# Patient Record
Sex: Female | Born: 2000 | Race: Black or African American | Hispanic: No | Marital: Single | Smoking: Never smoker
Health system: Southern US, Community
[De-identification: ages and names within clinical notes are randomized; demographics above are authoritative.]

## PROBLEM LIST (undated history)

## (undated) DIAGNOSIS — L509 Urticaria, unspecified: Secondary | ICD-10-CM

## (undated) DIAGNOSIS — L309 Dermatitis, unspecified: Secondary | ICD-10-CM

## (undated) DIAGNOSIS — J302 Other seasonal allergic rhinitis: Secondary | ICD-10-CM

## (undated) HISTORY — DX: Other seasonal allergic rhinitis: J30.2

## (undated) HISTORY — DX: Urticaria, unspecified: L50.9

## (undated) HISTORY — DX: Dermatitis, unspecified: L30.9

---
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
## 2019-03-15 ENCOUNTER — Other Ambulatory Visit: Payer: Self-pay

## 2019-03-15 DIAGNOSIS — Z20822 Contact with and (suspected) exposure to covid-19: Secondary | ICD-10-CM

## 2019-03-16 LAB — NOVEL CORONAVIRUS, NAA: SARS-CoV-2, NAA: NOT DETECTED

## 2019-10-11 ENCOUNTER — Other Ambulatory Visit: Payer: Self-pay | Admitting: Family Medicine

## 2019-10-11 NOTE — Telephone Encounter (Signed)
Last visit 10/13/18 for rash

## 2019-10-31 ENCOUNTER — Encounter: Payer: Self-pay | Admitting: Family Medicine

## 2019-11-01 ENCOUNTER — Encounter: Payer: Self-pay | Admitting: Family Medicine

## 2020-10-28 ENCOUNTER — Other Ambulatory Visit: Payer: Self-pay

## 2021-02-13 DIAGNOSIS — H52223 Regular astigmatism, bilateral: Secondary | ICD-10-CM | POA: Diagnosis not present

## 2021-02-13 DIAGNOSIS — H5203 Hypermetropia, bilateral: Secondary | ICD-10-CM | POA: Diagnosis not present

## 2021-11-24 ENCOUNTER — Encounter: Payer: Self-pay | Admitting: Women's Health

## 2021-11-24 ENCOUNTER — Other Ambulatory Visit: Payer: Self-pay

## 2021-11-24 ENCOUNTER — Other Ambulatory Visit (HOSPITAL_COMMUNITY)
Admission: RE | Admit: 2021-11-24 | Discharge: 2021-11-24 | Disposition: A | Discharge status: Home / Self Care | Payer: Managed Care, Other (non HMO) | Source: Ambulatory Visit | Attending: Women's Health | Admitting: Women's Health

## 2021-11-24 ENCOUNTER — Ambulatory Visit (INDEPENDENT_AMBULATORY_CARE_PROVIDER_SITE_OTHER): Payer: Managed Care, Other (non HMO) | Admitting: Women's Health

## 2021-11-24 VITALS — BP 139/94 | HR 114 | Ht 64.0 in | Wt 134.0 lb

## 2021-11-24 DIAGNOSIS — R030 Elevated blood-pressure reading, without diagnosis of hypertension: Secondary | ICD-10-CM

## 2021-11-24 DIAGNOSIS — Z01419 Encounter for gynecological examination (general) (routine) without abnormal findings: Secondary | ICD-10-CM | POA: Diagnosis present

## 2021-11-24 NOTE — Patient Instructions (Signed)
Check bp twice daily

## 2021-11-24 NOTE — Progress Notes (Signed)
WELL-WOMAN EXAMINATION Patient name: Alexis Barber MRN [id number]  Date of birth: [birth year] Chief Complaint:   New Patient (Initial Visit) and Gynecologic Exam  History of Present Illness:   Alexis Barber is a 21 y.o. G0P0000 African-American female being seen today for a routine well-woman exam.  Current complaints: none BP elevated, no h/o HTN, thinks dad has HTN. Doesn't feel anxiou  PCP: Luking      does not desire labs Patient's last menstrual period was 11/02/2021 (exact date). The current method of family planning is abstinence and OCP (estrogen/progesterone).  Last pap never. Results were: N/A. H/O abnormal pap: no Last mammogram: never. Results were: N/A. Family h/o breast cancer: no Last colonoscopy: never. Results were: N/A. Family h/o colorectal cancer: no  Depression screen [facility] 2/9 11/24/2021  Decreased Interest 0  Down, Depressed, Hopeless 0  PHQ - 2 Score 0  Altered sleeping 0  Tired, decreased energy 0  Change in appetite 0  Feeling bad or failure about yourself  0  Trouble concentrating 0  Moving slowly or fidgety/restless 0  Suicidal thoughts 0  PHQ-9 Score 0     GAD 7 : Generalized Anxiety Score 11/24/2021  Nervous, Anxious, on Edge 0  Control/stop worrying 0  Worry too much - different things 0  Trouble relaxing 0  Restless 0  Easily annoyed or irritable 0  Afraid - awful might happen 0  Total GAD 7 Score 0     Review of Systems:   Pertinent items are noted in HPI Denies any headaches, blurred vision, fatigue, shortness of breath, chest pain, abdominal pain, abnormal vaginal discharge/itching/odor/irritation, problems with periods, bowel movements, urination, or intercourse unless otherwise stated above. Pertinent History Reviewed:  Reviewed past medical,surgical, social and family history.  Reviewed problem list, medications and allergies. Physical Assessment:   Vitals:   11/24/21 0854 11/24/21 0855 11/24/21 0923  BP: (!) 151/100 (!)  142/92 (!) 139/94  Pulse: (!) 113 (!) 125 (!) 114  Weight: 134 lb (60.8 kg)    Height: 5\' 4"  (1.626 m)    Body mass index is 23 kg/m.        Physical Examination:   General appearance - well appearing, and in no distress  Mental status - alert, oriented to person, place, and time  Psych:  She has a normal mood and affect  Skin - warm and dry, normal color, no suspicious lesions noted  Chest - effort normal, all lung fields clear to auscultation bilaterally  Heart - normal rate and regular rhythm  Neck:  midline trachea, no thyromegaly or nodules  Breasts - breasts appear normal, no suspicious masses, no skin or nipple changes or  axillary nodes  Abdomen - soft, nontender, nondistended, no masses or organomegaly  Pelvic - VULVA: normal appearing vulva with no masses, tenderness or lesions  VAGINA: normal appearing vagina with normal color and discharge, no lesions  CERVIX: normal appearing cervix without discharge or lesions, no CMT  Thin prep pap is done w/ reflex HR HPV cotesting  UTERUS: uterus is felt to be normal size, shape, consistency and nontender   ADNEXA: No adnexal masses or tenderness noted.  Extremities:  No swelling or varicosities noted  Chaperone: Angel Neas    No results found for this or any previous visit (from the past 24 hour(s)).  Assessment & Plan:  1) Well-Woman Exam  2) Elevated bp> no h/o HTN, repeat after exam still elevated. Pt to check bp's bid at home, bring log back  in 1wk and do bp check w/ nurse. If still elevated discussed need to go to progestin only methods- states she would want to stick w/ pills  Labs/procedures today: pap  Mammogram: @ 22yo, or sooner if problems Colonoscopy: @ 22yo, or sooner if problems  No orders of the defined types were placed in this encounter.   Meds: No orders of the defined types were placed in this encounter.   Follow-up: Return in about 1 week (around 12/01/2021) for nurse bp check.  Cheral Marker CNM,  [facility] 11/24/2021 9:34 AM

## 2021-11-26 LAB — CYTOLOGY - PAP
Adequacy: ABSENT
Chlamydia: NEGATIVE
Comment: NEGATIVE
Comment: NORMAL
Diagnosis: NEGATIVE
Neisseria Gonorrhea: NEGATIVE

## 2021-11-30 ENCOUNTER — Encounter: Payer: Self-pay | Admitting: Women's Health

## 2021-12-01 ENCOUNTER — Ambulatory Visit: Payer: Managed Care, Other (non HMO) | Admitting: *Deleted

## 2021-12-01 ENCOUNTER — Other Ambulatory Visit: Payer: Self-pay

## 2021-12-01 VITALS — BP 144/93 | HR 78

## 2021-12-01 DIAGNOSIS — Z0130 Encounter for examination of blood pressure without abnormal findings: Secondary | ICD-10-CM

## 2021-12-01 MED ORDER — SLYND 4 MG PO TABS: 1.0000 | ORAL_TABLET | Freq: Every day | ORAL | 3 refills | Status: DC

## 2021-12-01 NOTE — Addendum Note (Signed)
Addended by: Cheral Marker on: 12/01/2021 03:21 PM   Modules accepted: Orders

## 2021-12-01 NOTE — Progress Notes (Signed)
° °  NURSE VISIT- BLOOD PRESSURE CHECK  SUBJECTIVE:  Alexis Barber is a 21 y.o. G0P0000 female here for BP check. She is a GYN patient    HYPERTENSION ROS:  GYN patient: Taking medicines as instructed not applicable Headaches  No Chest pain No Shortness of breath No Swelling in legs/ankles No  OBJECTIVE:  BP (!) 144/93 (BP Location: Right Arm, Patient Position: Sitting, Cuff Size: Normal)    Pulse 78    LMP 11/02/2021 (Exact Date)   Appearance alert, well appearing, and in no distress.  ASSESSMENT: GYN  blood pressure check  PLAN: Discussed with Joellyn Haff, CNM, [facility]   Recommendations: new prescription will be sent  samples of slynd given Follow-up: as scheduled   Annamarie Dawley  12/01/2021 3:05 PM

## 2022-08-03 ENCOUNTER — Encounter: Payer: Self-pay | Admitting: Women's Health

## 2022-08-03 ENCOUNTER — Other Ambulatory Visit: Payer: Self-pay | Admitting: Women's Health

## 2022-10-05 DIAGNOSIS — L239 Allergic contact dermatitis, unspecified cause: Secondary | ICD-10-CM | POA: Diagnosis not present

## 2022-10-05 DIAGNOSIS — Z6820 Body mass index (BMI) 20.0-20.9, adult: Secondary | ICD-10-CM | POA: Diagnosis not present

## 2022-10-06 ENCOUNTER — Telehealth: Payer: Commercial Managed Care - PPO | Admitting: Family

## 2022-10-06 DIAGNOSIS — T7840XA Allergy, unspecified, initial encounter: Secondary | ICD-10-CM

## 2022-10-06 MED ORDER — PREDNISONE 10 MG (21) PO TBPK: ORAL_TABLET | ORAL | 0 refills | Status: DC

## 2022-10-06 NOTE — Progress Notes (Signed)
E Visit for Rash  We are sorry that you are not feeling well. Here is how we plan to help!    Based on what you shared with me you may have  an allergic reaction.   You should continue zyrtec and benadryl. I have also sent in Prednisone 10 mg daily for 6 days (see taper instructions below)  Directions for 6 day taper: Day 1: 2 tablets before breakfast, 1 after both lunch & dinner and 2 at bedtime Day 2: 1 tab before breakfast, 1 after both lunch & dinner and 2 at bedtime Day 3: 1 tab at each meal & 1 at bedtime Day 4: 1 tab at breakfast, 1 at lunch, 1 at bedtime Day 5: 1 tab at breakfast & 1 tab at bedtime Day 6: 1 tab at breakfast    HOME CARE:  Take cool showers and avoid direct sunlight. Apply cool compress or wet dressings. Take a bath in an oatmeal bath.  Sprinkle content of one Aveeno packet under running faucet with comfortably warm water.  Bathe for 15-20 minutes, 1-2 times daily.  Pat dry with a towel. Do not rub the rash. Use hydrocortisone cream. Take an antihistamine like Benadryl for widespread rashes that itch.  The adult dose of Benadryl is 25-50 mg by mouth 4 times daily. Caution:  This type of medication may cause sleepiness.  Do not drink alcohol, drive, or operate dangerous machinery while taking antihistamines.  Do not take these medications if you have prostate enlargement.  Read package instructions thoroughly on all medications that you take.  GET HELP RIGHT AWAY IF:  Symptoms don't go away after treatment. Severe itching that persists. If you rash spreads or swells. If you rash begins to smell. If it blisters and opens or develops a yellow-brown crust. You develop a fever. You have a sore throat. You become short of breath.  MAKE SURE YOU:  Understand these instructions. Will watch your condition. Will get help right away if you are not doing well or get worse.  Thank you for choosing an e-visit.  Your e-visit answers were reviewed by a board  certified advanced clinical practitioner to complete your personal care plan. Depending upon the condition, your plan could have included both over the counter or prescription medications.  Please review your pharmacy choice. Make sure the pharmacy is open so you can pick up prescription now. If there is a problem, you may contact your provider through [employer] and have the prescription routed to another pharmacy.  Your safety is important to [place]. If you have drug allergies check your prescription carefully.   For the next 24 hours you can use MyChart to ask questions about today's visit, request a non-urgent call back, or ask for a work or school excuse. You will get an email in the next two days asking about your experience. I hope that your e-visit has been valuable and will speed your recovery.  Approximately 5 minutes was spent documenting and reviewing patient's chart.

## 2022-10-07 ENCOUNTER — Encounter: Payer: Self-pay | Admitting: Family Medicine

## 2022-10-07 ENCOUNTER — Ambulatory Visit (INDEPENDENT_AMBULATORY_CARE_PROVIDER_SITE_OTHER): Payer: Commercial Managed Care - PPO | Admitting: Family Medicine

## 2022-10-07 VITALS — BP 126/85 | HR 90 | Temp 98.4°F | Wt 119.2 lb

## 2022-10-07 DIAGNOSIS — L509 Urticaria, unspecified: Secondary | ICD-10-CM | POA: Insufficient documentation

## 2022-10-07 MED ORDER — FAMOTIDINE 20 MG PO TABS: 20.0000 mg | ORAL_TABLET | Freq: Two times a day (BID) | ORAL | 0 refills | Status: DC

## 2022-10-07 MED ORDER — CETIRIZINE HCL 10 MG PO TABS: 10.0000 mg | ORAL_TABLET | Freq: Every day | ORAL | 3 refills | Status: DC

## 2022-10-07 MED ORDER — PREDNISONE 10 MG PO TABS: ORAL_TABLET | ORAL | 0 refills | Status: DC

## 2022-10-07 NOTE — Patient Instructions (Signed)
Stop current prednisone Rx.  Start medications prescribed.  Allergy referral placed.  Call with concerns.  Take care  Dr. Lacinda Axon

## 2022-10-07 NOTE — Progress Notes (Signed)
Subjective:  Patient ID: Alexis Barber, female    DOB: 2001-09-28  Age: 22 y.o. MRN: [id number]  CC: Chief Complaint  Patient presents with   Allergic Reaction    Pt arrives due to possible allergic reaction. Having hives of face, hands, arms and legs. Facial swelling. Pt states she seen an allergist about 3 years ago due to possible reaction to oranges, peanut butter and seafood. Pt reports past 4 days she has had a reaction to every thing she eats. Urgent Care visit 10/06/22    HPI:  21 year old female presents for evaluation of hives.  Patient states that she has had ongoing hives for the past 4 days.  Recently had an urgent care visit and was started on prednisone.  She has a bottle with her today.  She was prescribed 20 mg daily for 5 days.  She continues to have hives particularly on the face and upper extremities.  Associated itching.  Mild swelling.  No reports of shortness of breath.  No other medications or interventions tried.  She is unsure of any new changes or exposures.  Denies any new foods or new topical agents.  Denies any new environmental exposures.  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   Tobacco Use   Smoking status: Never   Smokeless tobacco: Never  Vaping Use   Vaping Use: Never used  Substance and Sexual Activity   Alcohol use: No   Drug use: No   Sexual activity: Never    Birth control/protection: Abstinence, Pill  Other Topics Concern   Not on file  Social History Narrative   Not on file   Social Determinants of Health   Financial Resource Strain: Low  Risk  (11/24/2021)   Overall Financial Resource Strain (CARDIA)    Difficulty of Paying Living Expenses: Not hard at all  Food Insecurity: No Food Insecurity (11/24/2021)   Hunger Vital Sign    Worried About Running Out of Food in the Last Year: Never true    [place] in the Last Year: Never true  Transportation Needs: No Transportation Needs (11/24/2021)   PRAPARE - Hydrologist (Medical): No    Lack of Transportation (Non-Medical): No  Physical Activity: Insufficiently Active (11/24/2021)   Exercise Vital Sign    Days of Exercise per Week: 5 days    Minutes of Exercise per Session: 20 min  Stress: No Stress Concern Present (11/24/2021)   [place]    Feeling of Stress : Not at all  Social Connections: Moderately Isolated (11/24/2021)   Social Connection and Isolation Panel [NHANES]    Frequency of Communication with Friends and Family: More than three times a week    Frequency of Social Gatherings with Friends and Family: Twice a week    Attends Religious Services: 1 to 4 times per year    Active Member of [employer] or Organizations: No    Attends Archivist Meetings: Never  Marital Status: Never married    Review of Systems Per HPI  Objective:  BP 126/85   Pulse 90   Temp 98.4 F (36.9 C)   Wt 119 lb 3.2 oz (54.1 kg)   SpO2 100%   BMI 20.46 kg/m      10/07/2022    4:09 PM 12/01/2021    3:04 PM 11/24/2021    9:23 AM  BP/Weight  Systolic BP [phone]  Diastolic BP 85 93 94  Wt. (Lbs) 119.2    BMI 20.46 kg/m2      Physical Exam Constitutional:      Appearance: Normal appearance.  HENT:     Head: Normocephalic and atraumatic.  Pulmonary:     Effort: Pulmonary effort is normal. No respiratory distress.  Skin:    Comments: Raised areas of erythema consistent with urticaria.  Located primarily on the face and upper extremities.  Neurological:     Mental  Status: She is alert.  Psychiatric:        Mood and Affect: Mood normal.        Behavior: Behavior normal.     Assessment & Plan:   Problem List Items Addressed This Visit       Musculoskeletal and Integument   Urticaria - Primary    Patient experiencing urticaria.  Has a history of allergic rhinitis as well as food allergy.  She is unsure of any new exposures that may have caused her symptoms.  Stopping current prescription of prednisone.  Starting 10-day taper.  Advised Zyrtec daily.  Pepcid prescribed as well.  Referral to allergy placed.      Relevant Orders   Ambulatory referral to Allergy    Meds ordered this encounter  Medications   predniSONE (DELTASONE) 10 MG tablet    Sig: 50 mg daily x 2 days, then 40 mg daily x 2 days, then 30 mg daily x 2 days, then 20 mg daily x 2 days, then 10 mg daily x 2 days.    Dispense:  30 tablet    Refill:  0   cetirizine (ZYRTEC ALLERGY) 10 MG tablet    Sig: Take 1 tablet (10 mg total) by mouth daily.    Dispense:  30 tablet    Refill:  3   famotidine (PEPCID) 20 MG tablet    Sig: Take 1 tablet (20 mg total) by mouth 2 (two) times daily.    Dispense:  60 tablet    Refill:  0    Follow-up:  Return if symptoms worsen or fail to improve.  [place]

## 2022-10-07 NOTE — Assessment & Plan Note (Signed)
Patient experiencing urticaria.  Has a history of allergic rhinitis as well as food allergy.  She is unsure of any new exposures that may have caused her symptoms.  Stopping current prescription of prednisone.  Starting 10-day taper.  Advised Zyrtec daily.  Pepcid prescribed as well.  Referral to allergy placed.

## 2022-10-19 ENCOUNTER — Ambulatory Visit: Payer: Managed Care, Other (non HMO) | Admitting: Family Medicine

## 2022-11-15 ENCOUNTER — Ambulatory Visit (INDEPENDENT_AMBULATORY_CARE_PROVIDER_SITE_OTHER): Payer: BC Managed Care – PPO | Admitting: Internal Medicine

## 2022-11-15 ENCOUNTER — Encounter: Payer: Self-pay | Admitting: Internal Medicine

## 2022-11-15 ENCOUNTER — Other Ambulatory Visit: Payer: Self-pay

## 2022-11-15 VITALS — BP 122/82 | HR 66 | Temp 98.7°F | Resp 16 | Ht 64.37 in | Wt 122.8 lb

## 2022-11-15 DIAGNOSIS — L500 Allergic urticaria: Secondary | ICD-10-CM

## 2022-11-15 DIAGNOSIS — L501 Idiopathic urticaria: Secondary | ICD-10-CM | POA: Diagnosis not present

## 2022-11-15 DIAGNOSIS — J3089 Other allergic rhinitis: Secondary | ICD-10-CM | POA: Diagnosis not present

## 2022-11-15 DIAGNOSIS — J310 Chronic rhinitis: Secondary | ICD-10-CM

## 2022-11-15 DIAGNOSIS — J302 Other seasonal allergic rhinitis: Secondary | ICD-10-CM

## 2022-11-15 MED ORDER — FAMOTIDINE 20 MG PO TABS: 20.0000 mg | ORAL_TABLET | Freq: Two times a day (BID) | ORAL | 5 refills | Status: AC | PRN

## 2022-11-15 MED ORDER — AZELASTINE HCL 0.1 % NA SOLN: 1.0000 | Freq: Two times a day (BID) | NASAL | 5 refills | Status: AC

## 2022-11-15 MED ORDER — CETIRIZINE HCL 10 MG PO TABS: 10.0000 mg | ORAL_TABLET | Freq: Two times a day (BID) | ORAL | 5 refills | Status: AC | PRN

## 2022-11-15 NOTE — Patient Instructions (Addendum)
Idiopathic Urticaria (Hives): - At this time etiology of hives and swelling is unknown. Hives can be caused by a variety of different triggers including illness/infection, exercise, pressure, vibrations, extremes of temperature to name a few however majority of the time there is no identifiable trigger.  -We will obtain labs to rule out inflammatory/autoimmune/alpha gal/mast cell causes. -Start Zyrtec 3m daily.  -If hives reoccur, increase to Zyrtec 138mtwice daily.   -If hives still reoccur, add Pepcid 2025mwice daily and continue Zyrtec 1m36mice daily. -If still no improvement, increase to Zyrtec 20mg54mce daily and Pepcid 40mg 37me daily. - Peanut and orange were negative so do not think that is causing your symptoms. Okay to reintroduce.   Allergic Rhinitis: - Positive skin test 11/2022: trees, grasses, weeds, mold, dust mite - Avoidance measures discussed. - Use nasal saline rinses before nose sprays such as with Neilmed Sinus Rinse.  Use distilled water.   - Use Azelastine 1-2 sprays each nostril twice daily as needed. Aim upward and outward. - Use Zyrtec 10 mg daily.  - Consider allergy shots as long term control of your symptoms by teaching your immune system to be more tolerant of your allergy triggers  ALLERGEN AVOIDANCE MEASURES   Dust Mites Use central air conditioning and heat; and change the filter monthly.  Pleated filters work better than mesh filters.  Electrostatic filters may also be used; wash the filter monthly.  Window air conditioners may be used, but do not clean the air as well as a central air conditioner.  Change or wash the filter monthly. Keep windows closed.  Do not use attic fans.   Encase the mattress, box springs and pillows with zippered, dust proof covers. Wash the bed linens in hot water weekly.   Remove carpet, especially from the bedroom. Remove stuffed animals, throw pillows, dust ruffles, heavy drapes and other items that collect dust from the  bedroom. Do not use a humidifier.   Use wood, vinyl or leather furniture instead of cloth furniture in the bedroom. Keep the indoor humidity at 30 - 40%.  Monitor with a humidity gauge.  Molds - Indoor avoidance Use air conditioning to reduce indoor humidity.  Do not use a humidifier. Keep indoor humidity at 30 - 40%.  Use a dehumidifier if needed. In the bathroom use an exhaust fan or open a window after showering.  Wipe down damp surfaces after showering.  Clean bathrooms with a mold-killing solution (diluted bleach, or products like Tilex, etc) at least once a month. In the kitchen use an exhaust fan to remove steam from cooking.  Throw away spoiled foods immediately, and empty garbage daily.  Empty water pans below self-defrosting refrigerators frequently. Vent the clothes dryer to the outside. Limit indoor houseplants; mold grows in the dirt.  No houseplants in the bedroom. Remove carpet from the bedroom. Encase the mattress and box springs with a zippered encasing.  Molds - Outdoor avoidance Avoid being outside when the grass is being mowed, or the ground is tilled. Avoid playing in leaves, pine straw, hay, etc.  Dead plant materials contain mold. Avoid going into barns or grain storage areas. Remove leaves, clippings and compost from around the home. Pollen Avoidance Pollen levels are highest during the mid-day and afternoon.  Consider this when planning outdoor activities. Avoid being outside when the grass is being mowed, or wear a mask if the pollen-allergic person must be the one to mow the grass. Keep the windows closed to keep pollen  outside of the home. Use an air conditioner to filter the air. Take a shower, wash hair, and change clothing after working or playing outdoors during pollen season.

## 2022-11-15 NOTE — Progress Notes (Signed)
NEW PATIENT  Date of Service/Encounter:  11/15/22  Consult requested by: Coral Spikes, DO   Subjective:   Alexis Barber (DOB: 02-28-2001) is a 22 y.o. female who presents to the clinic on 11/15/2022 with a chief complaint of Allergic Reaction (States she broke out in hives about three years ago when she consumes peanut butter and oranges. States about a month ago she broke out in hives for a week straight after consuming all foods. ) and Angioedema (States she was swollen and hands and feet from that allergic reaction to consuming all foods. ) .    History obtained from: chart review and patient.   Hives/Angioedema: Initial onset 3-4 years ago with itchy, raised, red bumps.  Initially thought it was related to peanuts and oranges but has avoided those from diet and still having random episodes.  Hives can occur without a clear trigger; unable to pinpoint any foods.  No new medications.  Occurring about a few times a month and can last for a week.  Last episode about 1 month ago, she was prescribed prednisone and Pepcid and Zyrtec by Dr. Lacinda Axon.  She has tried Zyrtec for this in the past and it worked well but only when she was taking it daily.  No scarring or pain.   Also has had some episodes of hand/feet swelling with the hives.   10/07/2022: seen by Dr. Lacinda Axon PCP for facial swelling and hives.  Hives were noted on exam. Started on prednisone taper and daily Zyrtec and Pepcid.   11/13/2018: seen by Dr. Verlin Fester for hives. SPT was negative to peanut and orange.  sIgE was negative for peanut and orange. Also was treated for allergic rhinitis with Flonase and Xyzal.  Normal tryptase and negative alpha gal panel.   10/13/2018: seen by Eric Form NP for 1 month history of intermittent rash, concern for food allergy to oranage and peanuts.  Zyrtec resolved it. Referred to Allergy and to start daily Zyrtec.    Rhinitis:  Started in childhood.  Symptoms include: nasal congestion, rhinorrhea, post nasal  drainage, sneezing, watery eyes, and itchy eyes  Occurs seasonally-Spring Potential triggers: pollen Treatments tried:  Zyrtec PRN; last use was a 1 week ago  Previous allergy testing: yes blood test in 2020 History of reflux/heartburn: no History of sinus surgery: no Nonallergic triggers: none   Past Medical History: Past Medical History:  Diagnosis Date   Eczema    Seasonal allergies    Urticaria    Past Surgical History: Past Surgical History:  Procedure Laterality Date   removal of fatty tissue on spine  2003    Family History: Family History  Problem Relation Age of Onset   Asthma Maternal Grandmother    Eczema Sister    Asthma Other        family history    Arthritis Other        family history    Diabetes Other        family history    Heart defect Other        family history (CHD)   Urticaria Neg Hx    Immunodeficiency Neg Hx    Angioedema Neg Hx    Allergic rhinitis Neg Hx     Social History:  Lives in a 17 year house Flooring in bedroom: wood Pets: dog Tobacco use/exposure: none Job: personal shopper  Medication List:  Allergies as of 11/15/2022   No Known Allergies      Medication List  Accurate as of November 15, 2022 12:22 PM. If you have any questions, ask your nurse or doctor.          STOP taking these medications    famotidine 20 MG tablet Commonly known as: Pepcid Stopped by: Larose Kells, MD   predniSONE 10 MG tablet Commonly known as: DELTASONE Stopped by: Larose Kells, MD       TAKE these medications    cetirizine 10 MG tablet Commonly known as: ZyrTEC Allergy Take 1 tablet (10 mg total) by mouth daily.   Slynd 4 MG Tabs Generic drug: Drospirenone Take 1 tablet by mouth daily.         REVIEW OF SYSTEMS: Pertinent positives and negatives discussed in HPI.   Objective:   Physical Exam: BP 122/82   Pulse 66   Temp 98.7 F (37.1 C) (Temporal)   Resp 16   Ht 5' 4.37" (1.635 m)   Wt 122 lb  12.8 oz (55.7 kg)   SpO2 99%   BMI 20.84 kg/m  Body mass index is 20.84 kg/m. GEN: alert, well developed HEENT: clear conjunctiva, TM grey and translucent, nose with + inferior turbinate hypertrophy, pink nasal mucosa, slight clear rhinorrhea, no cobblestoning HEART: regular rate and rhythm, no murmur LUNGS: clear to auscultation bilaterally, no coughing, unlabored respiration ABDOMEN: soft, non distended  SKIN: no rashes or lesions  Reviewed:  10/07/2022: seen by Dr. Lacinda Axon PCP for facial swelling and hives.  Hives were noted on exam. Started on prednisone taper and daily Zyrtec and Pepcid.   11/13/2018: seen by Dr. Verlin Fester for hives. SPT was negative to peanut and orange.  sIgE was negative for peanut and orange. Also was treated for allergic rhinitis with Flonase and Xyzal.  Normal tryptase and negative alpha gal panel.   10/13/2018: seen by Eric Form NP for 1 month history of intermittent rash, concern for food allergy to oranage and peanuts.  Zyrtec resolved it. Referred to Allergy and to start daily Zyrtec.    Skin Testing:  Skin prick testing was placed, which includes aeroallergens/foods, histamine control, and saline control.  Verbal consent was obtained prior to placing test.  Patient tolerated procedure well.  Allergy testing results were read and interpreted by myself, documented by clinical staff. Adequate positive and negative control.  Results discussed with patient/family.  Airborne Adult Perc - 11/15/22 1143     Time Antigen Placed 1045    Allergen Manufacturer Lavella Hammock    Location Back    Number of Test 59    1. Control-Buffer 50% Glycerol Negative    2. Control-Histamine 1 mg/ml 3+    3. Albumin saline Negative    4. [place] Negative    5. [place] 3+    6. [place] Negative    7. [place] Blue 2+    8. Meadow Fescue 2+    9. Perennial Rye Negative    10. Sweet Vernal Negative    11. Timothy Negative    12. Cocklebur Negative    13. Burweed Marshelder 2+    14.  Ragweed, short Negative    15. Ragweed, Giant Negative    16. Plantain,  English Negative    17. Lamb's Quarters Negative    18. Sheep Sorrell Negative    19. Rough Pigweed 2+    20. Marsh Elder, Rough 2+    21. Mugwort, Common Negative    22. Ash mix Negative    23. Birch mix 3+    24. Steva Colder American 3+  25. Box, Elder 3+    26. Cedar, red Negative    27. Cottonwood, Eastern 2+    28. Elm mix 2+    29. Hickory 3+    31. Oak, [place] mix 3+    32. Pecan Pollen 3+    33. Pine mix 2+    34. Sycamore Eastern 2+    35. McLain, Black Pollen 2+    36. Alternaria alternata 2+    37. Cladosporium Herbarum 2+    38. Aspergillus mix Negative    39. Penicillium mix Negative    40. Bipolaris sorokiniana (Helminthosporium) Negative    41. Drechslera spicifera (Curvularia) 2+    42. Mucor plumbeus 2+    43. Fusarium moniliforme 2+    44. Aureobasidium pullulans (pullulara) Negative    45. Rhizopus oryzae Negative    46. Botrytis cinera Negative    47. Epicoccum nigrum 2+    48. Phoma betae 2+    49. Candida Albicans Negative    50. Trichophyton mentagrophytes Negative    51. Mite, D Farinae  5,000 AU/ml 3+    52. Mite, D Pteronyssinus  5,000 AU/ml 3+    53. Cat Hair 10,000 BAU/ml Negative    54.  Dog Epithelia Negative    55. Mixed Feathers Negative    56. Horse Epithelia Negative    57. Cockroach, German Negative    58. Mouse Negative    59. Tobacco Leaf Negative             Food Adult Perc - 11/15/22 1100     Time Antigen Placed 1045    Allergen Manufacturer Lavella Hammock    Location Back    Number of allergen test 59    1. Peanut Negative    56. Orange  Negative               Assessment:   1. Chronic idiopathic urticaria   2. Chronic rhinitis     Plan/Recommendations:  Idiopathic Urticaria (Hives): - At this time etiology of hives and swelling is unknown. Hives can be caused by a variety of different triggers including illness/infection, exercise, pressure,  vibrations, extremes of temperature to name a few however majority of the time there is no identifiable trigger.  -If symptoms persist, we will obtain labs to rule out inflammatory/autoimmune/alpha gal/mast cell causes. In 2020, alpha gal was negative and tryptase was normal.  -Start Zyrtec 10mg  daily.  -If hives reoccur, increase to Zyrtec 10mg  twice daily.   -If hives still reoccur, add Pepcid 20mg  twice daily and continue Zyrtec 10mg  twice daily. -If still no improvement, increase to Zyrtec 20mg  twice daily and Pepcid 40mg  twice daily. - Peanut and orange sIgE today 11/2022 were negative so do not think that peanut or orange exposure is causing your symptoms. Also in 2020, you had negative SPT and sIgE to peanut and oranges. Okay to reintroduce into diet.    Allergic Rhinitis: - Positive skin test 11/2022: trees, grasses, weeds, mold, dust mite - Avoidance measures discussed. - Use nasal saline rinses before nose sprays such as with Neilmed Sinus Rinse.  Use distilled water.   - Use Azelastine 1-2 sprays each nostril twice daily as needed. Aim upward and outward. - Use Zyrtec 10 mg daily.  - Consider allergy shots as long term control of your symptoms by teaching your immune system to be more tolerant of your allergy triggers.    Return in about 6 weeks (around 12/27/2022).  Harlon Flor, MD [facility]

## 2022-11-16 ENCOUNTER — Encounter: Payer: Self-pay | Admitting: Internal Medicine

## 2022-11-17 MED ORDER — EPINEPHRINE 0.3 MG/0.3ML IJ SOAJ: 0.3000 mg | INTRAMUSCULAR | 1 refills | Status: AC | PRN

## 2022-11-29 ENCOUNTER — Ambulatory Visit: Payer: Commercial Managed Care - PPO | Admitting: Women's Health

## 2023-01-04 ENCOUNTER — Ambulatory Visit: Payer: BC Managed Care – PPO | Admitting: Women's Health

## 2023-01-04 ENCOUNTER — Other Ambulatory Visit (HOSPITAL_COMMUNITY)
Admission: RE | Admit: 2023-01-04 | Discharge: 2023-01-04 | Disposition: A | Discharge status: Home / Self Care | Payer: BC Managed Care – PPO | Source: Ambulatory Visit | Attending: Women's Health | Admitting: Women's Health

## 2023-01-04 ENCOUNTER — Encounter: Payer: Self-pay | Admitting: Family Medicine

## 2023-01-04 ENCOUNTER — Encounter: Payer: Self-pay | Admitting: Women's Health

## 2023-01-04 VITALS — BP 156/101 | HR 148 | Ht 64.0 in | Wt 124.0 lb

## 2023-01-04 DIAGNOSIS — N921 Excessive and frequent menstruation with irregular cycle: Secondary | ICD-10-CM | POA: Diagnosis present

## 2023-01-04 DIAGNOSIS — Z01419 Encounter for gynecological examination (general) (routine) without abnormal findings: Secondary | ICD-10-CM

## 2023-01-04 DIAGNOSIS — Z803 Family history of malignant neoplasm of breast: Secondary | ICD-10-CM

## 2023-01-04 NOTE — Progress Notes (Signed)
WELL-WOMAN EXAMINATION Patient name: Alexis Barber MRN [id number]  Date of birth: [birth year] Chief Complaint:   Gynecologic Exam  History of Present Illness:   Alexis Barber is a 22 y.o. G0P0000 African-American female being seen today for a routine well-woman exam.  Current complaints: on Slynd (d/t elevated bp at physical last year), last few months has spotted x 2wks, off x 2wks, then spotting again for 2wks. Denies abnormal discharge, itching/odor/irritation. Never sexually active BP & HR elevated again this year, has had recent appts w/ PCP (126/85, 85 on 10/07/22) & allergy MD (122/82, 66 on 11/15/22)  PCP: Dr. Lacinda Axon      No LMP recorded. The current method of family planning is abstinence and oral progesterone-only contraceptive.  Last pap 11/24/21. Results were: NILM w/ HRHPV not done. H/O abnormal pap: no Last mammogram: never. Results were: N/A. Family h/o breast cancer: yes mom dx last year @ 97yo, radiation only, neg genetic testing Last colonoscopy: never. Results were: N/A. Family h/o colorectal cancer: no     01/04/2023    8:45 AM 10/07/2022    4:10 PM 11/24/2021    8:53 AM  Depression screen PHQ 2/9  Decreased Interest 0 0 0  Down, Depressed, Hopeless 0 0 0  PHQ - 2 Score 0 0 0  Altered sleeping 0  0  Tired, decreased energy 0  0  Change in appetite 0  0  Feeling bad or failure about yourself  0  0  Trouble concentrating 0  0  Moving slowly or fidgety/restless 0  0  Suicidal thoughts 0  0  PHQ-9 Score 0  0        01/04/2023    8:45 AM 11/24/2021    8:53 AM  GAD 7 : Generalized Anxiety Score  Nervous, Anxious, on Edge 0 0  Control/stop worrying 0 0  Worry too much - different things 0 0  Trouble relaxing 0 0  Restless 0 0  Easily annoyed or irritable 0 0  Afraid - awful might happen 0 0  Total GAD 7 Score 0 0     Review of Systems:   Pertinent items are noted in HPI Denies any headaches, blurred vision, fatigue, shortness of breath, chest pain, abdominal  pain, abnormal vaginal discharge/itching/odor/irritation, problems with periods, bowel movements, urination, or intercourse unless otherwise stated above. Pertinent History Reviewed:  Reviewed past medical,surgical, social and family history.  Reviewed problem list, medications and allergies. Physical Assessment:   Vitals:   01/04/23 0838 01/04/23 0853  BP: (!) 141/89 (!) 156/101  Pulse: (!) 144 (!) 148  Weight: 124 lb (56.2 kg)   Height: 5\' 4"  (1.626 m)   Body mass index is 21.28 kg/m.        Physical Examination:   General appearance - well appearing, and in no distress  Mental status - alert, oriented to person, place, and time  Psych:  She has a normal mood and affect  Skin - warm and dry, normal color, no suspicious lesions noted  Chest - effort normal, all lung fields clear to auscultation bilaterally  Heart - normal rate and regular rhythm  Neck:  midline trachea, no thyromegaly or nodules  Breasts - breasts appear normal, no suspicious masses, no skin or nipple changes or  axillary nodes  Abdomen - soft, nontender, nondistended, no masses or organomegaly  Pelvic - VULVA: normal appearing vulva with no masses, tenderness or lesions  VAGINA: normal appearing vagina with normal color and light brown discharge,  no lesions  CERVIX: normal appearing cervix without discharge or lesions, no CMT  Thin prep pap is not done, CV swab collected  UTERUS: uterus is felt to be normal size, shape, consistency and nontender   ADNEXA: No adnexal masses or tenderness noted.  Extremities:  No swelling or varicosities noted  Chaperone: Peggy Dones    No results found for this or any previous visit (from the past 24 hour(s)).  Assessment & Plan:  1) Well-Woman Exam  2) Breakthrough spotting on Slynd> will check CV swab, if neg will try micronor (d/t bp's, not sexually active)  3) Mom dx w/ breast cancer @ 42yo> neg genetic testing, pt to start mammograms at 22yo  4) Elevated bp & hr> same  last year, normal bp/hr at 2 recent visits w/ PCP & allergy MD, likely elevated here d/t anxiety about exam. Advised pt to call or send PCP a MyChart message today notifying him of bp here/see if needs to be seen  Labs/procedures today: CV swab  Mammogram: @ 22yo d/t family history, or sooner if problems Colonoscopy: @ 22yo, or sooner if problems  No orders of the defined types were placed in this encounter.   Meds: No orders of the defined types were placed in this encounter.   Follow-up: Return in about 1 year (around 01/04/2024) for Physical.  [place], [facility] 01/04/2023 9:19 AM

## 2023-01-04 NOTE — Patient Instructions (Signed)
Call/send a MyChart message to Dr. Lacinda Axon to let him know about your blood pressure and heart rate today

## 2023-01-05 LAB — CERVICOVAGINAL ANCILLARY ONLY
Bacterial Vaginitis (gardnerella): NEGATIVE
Candida Glabrata: NEGATIVE
Candida Vaginitis: NEGATIVE
Comment: NEGATIVE
Comment: NEGATIVE
Comment: NEGATIVE

## 2023-01-07 ENCOUNTER — Telehealth: Payer: BC Managed Care – PPO | Admitting: Nurse Practitioner

## 2023-01-07 DIAGNOSIS — J029 Acute pharyngitis, unspecified: Secondary | ICD-10-CM | POA: Diagnosis not present

## 2023-01-08 MED ORDER — AMOXICILLIN 500 MG PO CAPS: 500.0000 mg | ORAL_CAPSULE | Freq: Two times a day (BID) | ORAL | 0 refills | Status: AC

## 2023-01-08 NOTE — Progress Notes (Signed)

## 2023-01-10 MED ORDER — NORETHINDRONE 0.35 MG PO TABS: 1.0000 | ORAL_TABLET | Freq: Every day | ORAL | 3 refills | Status: DC

## 2023-01-10 NOTE — Addendum Note (Signed)
Addended by: Cheral Marker on: 01/10/2023 02:32 PM   Modules accepted: Orders

## 2023-01-17 ENCOUNTER — Ambulatory Visit: Payer: BC Managed Care – PPO | Admitting: Internal Medicine

## 2023-03-01 ENCOUNTER — Other Ambulatory Visit: Payer: Self-pay | Admitting: Adult Health

## 2023-03-01 ENCOUNTER — Encounter: Payer: Self-pay | Admitting: Women's Health

## 2023-03-01 MED ORDER — SLYND 4 MG PO TABS: 1.0000 | ORAL_TABLET | Freq: Every day | ORAL | 11 refills | Status: DC

## 2023-03-01 NOTE — Progress Notes (Signed)
Rx slynd 

## 2023-03-02 ENCOUNTER — Other Ambulatory Visit: Payer: Self-pay | Admitting: *Deleted

## 2023-03-02 MED ORDER — SLYND 4 MG PO TABS: 1.0000 | ORAL_TABLET | Freq: Every day | ORAL | 11 refills | Status: DC

## 2023-03-21 ENCOUNTER — Telehealth: Payer: BC Managed Care – PPO | Admitting: Physician Assistant

## 2023-03-21 DIAGNOSIS — K047 Periapical abscess without sinus: Secondary | ICD-10-CM

## 2023-03-21 MED ORDER — NAPROXEN 500 MG PO TABS
500.0000 mg | ORAL_TABLET | Freq: Two times a day (BID) | ORAL | 0 refills | Status: DC
Start: 2023-03-21 — End: 2023-05-25

## 2023-03-21 MED ORDER — AMOXICILLIN-POT CLAVULANATE 875-125 MG PO TABS
1.0000 | ORAL_TABLET | Freq: Two times a day (BID) | ORAL | 0 refills | Status: DC
Start: 2023-03-21 — End: 2023-05-25

## 2023-03-21 NOTE — Progress Notes (Signed)
I have spent 5 minutes in review of e-visit questionnaire, review and updating patient chart, medical decision making and response to patient.   Valory Wetherby Cody Aisea Bouldin, PA-C    

## 2023-03-21 NOTE — Progress Notes (Signed)

## 2023-05-20 ENCOUNTER — Encounter: Payer: Self-pay | Admitting: Women's Health

## 2023-05-25 ENCOUNTER — Telehealth: Payer: BC Managed Care – PPO | Admitting: Women's Health

## 2023-05-25 ENCOUNTER — Encounter: Payer: Self-pay | Admitting: Women's Health

## 2023-05-25 DIAGNOSIS — N921 Excessive and frequent menstruation with irregular cycle: Secondary | ICD-10-CM | POA: Diagnosis not present

## 2023-05-25 DIAGNOSIS — Z3009 Encounter for other general counseling and advice on contraception: Secondary | ICD-10-CM | POA: Diagnosis not present

## 2023-05-25 NOTE — Progress Notes (Signed)
TELEHEALTH VIRTUAL GYN VISIT ENCOUNTER NOTE Patient name: Alexis Barber MRN [id number]  Date of birth: May 20, 2001  I connected with patient on 05/25/23 at 10:10 AM EDT by MyChart video and verified that I am speaking with the correct person using two identifiers.  Pt is not currently in the office, she is at work.  Provider is in the office.    I discussed the limitations, risks, security and privacy concerns of performing an evaluation and management service by telephone and the availability of in person appointments. I also discussed with the patient that there may be a patient responsible charge related to this service. The patient expressed understanding and agreed to proceed.   Chief Complaint:   irregular bleeding (Been on Slynd for 3 months, 2 weeks out of month will bleed)  History of Present Illness:   Alexis Barber is a 22 y.o. G0P0000 African American female with reports of irregular bleeding on Slynd, bleeds about every other week. Happened w/ micronor as well. Denies abnormal discharge, itching/odor/irritation.  Doing POPs b/c of elevated bp, no dx of HTN. At our visit 01/04/23 bp was 141/89, hr 144 and 156/101 hr 148. Was instructed to f/u w/ PCP, states she talked w/ them and they told her to monitor home bp's and if any elevated to let them know, home bp's 110s/70-80s, none elevated. Discussed all options, really wants to stick w/ pills. Does not smoke, no h/o HTN, DVT/PE, CVA, MI, or migraines w/ aura. Is sexually active, irregular bleeding was going on before became sexually active.     01/04/2023    8:45 AM 10/07/2022    4:10 PM 11/24/2021    8:53 AM  Depression screen PHQ 2/9  Decreased Interest 0 0 0  Down, Depressed, Hopeless 0 0 0  PHQ - 2 Score 0 0 0  Altered sleeping 0  0  Tired, decreased energy 0  0  Change in appetite 0  0  Feeling bad or failure about yourself  0  0  Trouble concentrating 0  0  Moving slowly or fidgety/restless 0  0  Suicidal thoughts 0  0   PHQ-9 Score 0  0    Patient's last menstrual period was 05/14/2023. The current method of family planning is oral progesterone-only contraceptive.  Last pap 11/24/21. Results were:  NILM w/ HRHPV not done Review of Systems:   Pertinent items are noted in HPI Denies fever/chills, dizziness, headaches, visual disturbances, fatigue, shortness of breath, chest pain, abdominal pain, vomiting, abnormal vaginal discharge/itching/odor/irritation, problems with periods, bowel movements, urination, or intercourse unless otherwise stated above.  Pertinent History Reviewed:  Reviewed past medical,surgical, social, obstetrical and family history.  Reviewed problem list, medications and allergies. Physical Assessment:  There were no vitals filed for this visit.There is no height or weight on file to calculate BMI.       Physical Examination:   General:  Alert, oriented and cooperative.   Mental Status: Normal mood and affect perceived. Normal judgment and thought content.  Physical exam deferred due to nature of the encounter  No results found for this or any previous visit (from the past 24 hour(s)).  Assessment & Plan:  1) Irregular bleeding on Slynd> had same w/ micronor, was on POPs b/c of elevated bp's. Wants COCs. To come tomorrow am before work for bp check w/ nurse. If wnl, will rx COCs and f/u in  Meds: No orders of the defined types were placed in this encounter.  No orders of the defined types were placed in this encounter.   I discussed the assessment and treatment plan with the patient. The patient was provided an opportunity to ask questions and all were answered. The patient agreed with the plan and demonstrated an understanding of the instructions.   The patient was advised to call back or seek an in-person evaluation/go to the ED if the symptoms worsen or if the condition fails to improve as anticipated.  I provided 10 minutes of non-face-to-face time during this  encounter.   No follow-ups on file.  Cheral Marker CNM, [facility] 05/25/2023 10:19 AM

## 2023-05-26 ENCOUNTER — Ambulatory Visit: Payer: BC Managed Care – PPO | Admitting: *Deleted

## 2023-05-26 VITALS — BP 154/100 | HR 120

## 2023-05-26 DIAGNOSIS — Z0130 Encounter for examination of blood pressure without abnormal findings: Secondary | ICD-10-CM

## 2023-05-26 NOTE — Progress Notes (Signed)
   NURSE VISIT- BLOOD PRESSURE CHECK  SUBJECTIVE:  Alexis Barber is a 22 y.o. G0P0000 female here for BP check. She is a GYN patient    HYPERTENSION ROS:    GYN patient: Taking medicines as instructed not applicable Headaches  No Chest pain No Shortness of breath No Swelling in legs/ankles No  OBJECTIVE:  BP (!) 154/100 (BP Location: Right Arm, Patient Position: Sitting, Cuff Size: Normal)   Pulse (!) 120   LMP 05/14/2023   Appearance alert, well appearing, and in no distress.  ASSESSMENT: GYN  blood pressure check  PLAN: Discussed with Joellyn Haff, CNM, [facility]   Recommendations:  Selena Batten spoke to patient in the office. Pt still has elevated bp. She will followup with her pcp.     Follow-up:  prn    Annamarie Dawley  05/26/2023 9:14 AM

## 2023-07-09 ENCOUNTER — Telehealth: Payer: Commercial Managed Care - PPO | Admitting: Physician Assistant

## 2023-07-09 DIAGNOSIS — J039 Acute tonsillitis, unspecified: Secondary | ICD-10-CM

## 2023-07-10 MED ORDER — AMOXICILLIN 875 MG PO TABS
875.0000 mg | ORAL_TABLET | Freq: Two times a day (BID) | ORAL | 0 refills | Status: DC
Start: 2023-07-10 — End: 2023-07-10

## 2023-07-10 MED ORDER — AMOXICILLIN 875 MG PO TABS
875.0000 mg | ORAL_TABLET | Freq: Two times a day (BID) | ORAL | 0 refills | Status: AC
Start: 2023-07-10 — End: 2023-07-20

## 2023-07-10 NOTE — Addendum Note (Signed)
Addended by: Margaretann Loveless on: 07/10/2023 07:37 AM   Modules accepted: Orders

## 2023-07-10 NOTE — Progress Notes (Signed)

## 2023-07-14 ENCOUNTER — Telehealth: Payer: Commercial Managed Care - PPO | Admitting: Physician Assistant

## 2023-07-14 DIAGNOSIS — B999 Unspecified infectious disease: Secondary | ICD-10-CM

## 2023-07-14 NOTE — Progress Notes (Signed)
Because of progressing symptoms despite treatment via e-visit, I feel your condition warrants further evaluation and I recommend that you be seen in a face to face visit.   NOTE: There will be NO CHARGE for this eVisit   If you are having a true medical emergency please call 911.      For an urgent face to face visit, [place] has eight urgent care centers for your convenience:   NEW!! [facility] Center at [facility] Get Driving Directions [phone] [address] [place], [address]    [facility] Health [facility] Care Center at [facility] Get Driving Directions [phone] [address] [place], [place] [address]   [facility] Health Urgent Care Center [facility]) Get Driving Directions [phone] [address] [place], [place] [address]  [facility] Health Urgent Care Center [facility]) Get Driving Directions [phone] [address] [place],  [place]  [address]  [facility] Health Urgent Care Center [facility] - at [employer]  [phone] [address] [place],  [place] [address]   [facility] Health Urgent Care at [facility] Get Driving Directions [phone] [address] [place], [place] [address]   [facility] Health Urgent Care at [facility] Get Driving Directions  [phone] [address] [place], [place] [address]   [facility] Health Urgent Care at [facility] Directions [phone] [address], [address] [place], [place] [address]  Your MyChart E-visit questionnaire answers were reviewed by a board certified advanced clinical practitioner to complete your personal care plan based on your specific symptoms.  Thank you for using e-Visits.

## 2023-08-07 ENCOUNTER — Telehealth: Payer: Commercial Managed Care - PPO | Admitting: Physician Assistant

## 2023-08-07 DIAGNOSIS — J069 Acute upper respiratory infection, unspecified: Secondary | ICD-10-CM

## 2023-08-07 MED ORDER — FLUTICASONE PROPIONATE 50 MCG/ACT NA SUSP: 2.0000 | Freq: Every day | NASAL | 6 refills | Status: DC

## 2023-08-07 NOTE — Progress Notes (Addendum)

## 2024-02-01 ENCOUNTER — Ambulatory Visit: Payer: Self-pay | Admitting: Women's Health

## 2024-07-11 ENCOUNTER — Telehealth: Admitting: Physician Assistant

## 2024-07-11 DIAGNOSIS — B9689 Other specified bacterial agents as the cause of diseases classified elsewhere: Secondary | ICD-10-CM

## 2024-07-11 DIAGNOSIS — J019 Acute sinusitis, unspecified: Secondary | ICD-10-CM

## 2024-07-11 MED ORDER — AMOXICILLIN-POT CLAVULANATE 875-125 MG PO TABS
1.0000 | ORAL_TABLET | Freq: Two times a day (BID) | ORAL | 0 refills | Status: AC
Start: 2024-07-11 — End: ?

## 2024-07-11 NOTE — Progress Notes (Signed)

## 2024-09-07 ENCOUNTER — Telehealth: Admitting: Family Medicine

## 2024-09-07 DIAGNOSIS — L509 Urticaria, unspecified: Secondary | ICD-10-CM

## 2024-09-07 MED ORDER — PREDNISONE 10 MG (21) PO TBPK: ORAL_TABLET | ORAL | 0 refills | Status: AC

## 2024-09-07 NOTE — Progress Notes (Signed)
 E Visit for Rash  We are sorry that you are not feeling well. Here is how we plan to help!  Prednisone  10 mg daily for 6 days (see taper instructions below)  HOME CARE:  Take cool showers and avoid direct sunlight. Apply cool compress or wet dressings. Take a bath in an oatmeal bath.  Sprinkle content of one Aveeno packet under running faucet with comfortably warm water.  Bathe for 15-20 minutes, 1-2 times daily.  Pat dry with a towel. Do not rub the rash. Use hydrocortisone  cream. Take an antihistamine like Benadryl for widespread rashes that itch.  The adult dose of Benadryl is 25-50 mg by mouth 4 times daily. Caution:  This type of medication may cause sleepiness.  Do not drink alcohol, drive, or operate dangerous machinery while taking antihistamines.  Do not take these medications if you have prostate enlargement.  Read package instructions thoroughly on all medications that you take.  GET HELP RIGHT AWAY IF:  Symptoms don't go away after treatment. Severe itching that persists. If you rash spreads or swells. If you rash begins to smell. If it blisters and opens or develops a yellow-brown crust. You develop a fever. You have a sore throat. You become short of breath.  MAKE SURE YOU:  Understand these instructions. Will watch your condition. Will get help right away if you are not doing well or get worse.  Thank you for choosing an e-visit. Your e-visit answers were reviewed by a board certified advanced clinical practitioner to complete your personal care plan. Depending upon the condition, your plan could have included both over the counter or prescription medications. Please review your pharmacy choice. Be sure that the pharmacy you have chosen is open so that you can pick up your prescription now.  If there is a problem you may message your provider in MyChart to have the prescription routed to another pharmacy. Your safety is important to us . If you have drug allergies  check your prescription carefully.  For the next 24 hours, you can use MyChart to ask questions about today's visit, request a non-urgent call back, or ask for a work or school excuse from your e-visit provider. You will get an email in the next two days asking about your experience. I hope that your e-visit has been valuable and will speed your recovery.  I have spent 5 minutes in review of e-visit questionnaire, review and updating patient chart, medical decision making and response to patient.   Keelan Tripodi, FNP
